# Patient Record
Sex: Male | Born: 1995 | Race: Black or African American | Hispanic: No | Marital: Single | Smoking: Current every day smoker
Health system: Southern US, Community
[De-identification: ages and names within clinical notes are randomized; demographics above are authoritative.]

---
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
## 2014-12-24 ENCOUNTER — Encounter (HOSPITAL_COMMUNITY): Payer: Self-pay | Admitting: *Deleted

## 2014-12-24 ENCOUNTER — Emergency Department (HOSPITAL_COMMUNITY): Payer: No Typology Code available for payment source

## 2014-12-24 ENCOUNTER — Emergency Department (HOSPITAL_COMMUNITY)
Admission: EM | Admit: 2014-12-24 | Discharge: 2014-12-24 | Disposition: A | Discharge status: Home / Self Care | Payer: No Typology Code available for payment source | Attending: Emergency Medicine | Admitting: Emergency Medicine

## 2014-12-24 DIAGNOSIS — Y998 Other external cause status: Secondary | ICD-10-CM | POA: Diagnosis not present

## 2014-12-24 DIAGNOSIS — S8992XA Unspecified injury of left lower leg, initial encounter: Secondary | ICD-10-CM | POA: Insufficient documentation

## 2014-12-24 DIAGNOSIS — M79641 Pain in right hand: Secondary | ICD-10-CM

## 2014-12-24 DIAGNOSIS — S29012A Strain of muscle and tendon of back wall of thorax, initial encounter: Secondary | ICD-10-CM | POA: Diagnosis not present

## 2014-12-24 DIAGNOSIS — S6991XA Unspecified injury of right wrist, hand and finger(s), initial encounter: Secondary | ICD-10-CM | POA: Diagnosis not present

## 2014-12-24 DIAGNOSIS — Y92410 Unspecified street and highway as the place of occurrence of the external cause: Secondary | ICD-10-CM | POA: Insufficient documentation

## 2014-12-24 DIAGNOSIS — M25562 Pain in left knee: Secondary | ICD-10-CM

## 2014-12-24 DIAGNOSIS — Z720 Tobacco use: Secondary | ICD-10-CM | POA: Insufficient documentation

## 2014-12-24 DIAGNOSIS — Y9389 Activity, other specified: Secondary | ICD-10-CM | POA: Insufficient documentation

## 2014-12-24 MED ORDER — IBUPROFEN 800 MG PO TABS
800.0000 mg | ORAL_TABLET | Freq: Once | ORAL | Status: AC
  Administered 2014-12-24: 800 mg via ORAL
  Filled 2014-12-24: qty 1
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
## 2014-12-24 NOTE — ED Notes (Signed)
Pt is in stable condition upon d/c and is escorted from ED via wheelchair. 
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
## 2014-12-24 NOTE — ED Notes (Signed)
Patient transported to X-ray 

## 2015-01-15 ENCOUNTER — Emergency Department (HOSPITAL_COMMUNITY)
Admission: EM | Admit: 2015-01-15 | Discharge: 2015-01-16 | Disposition: A | Discharge status: Home / Self Care | Payer: Medicaid Other | Attending: Emergency Medicine | Admitting: Emergency Medicine

## 2015-01-15 ENCOUNTER — Encounter (HOSPITAL_COMMUNITY): Payer: Self-pay | Admitting: Emergency Medicine

## 2015-01-15 DIAGNOSIS — Y92810 Car as the place of occurrence of the external cause: Secondary | ICD-10-CM | POA: Diagnosis not present

## 2015-01-15 DIAGNOSIS — Y9389 Activity, other specified: Secondary | ICD-10-CM | POA: Diagnosis not present

## 2015-01-15 DIAGNOSIS — S0993XA Unspecified injury of face, initial encounter: Secondary | ICD-10-CM | POA: Diagnosis present

## 2015-01-15 DIAGNOSIS — S0181XA Laceration without foreign body of other part of head, initial encounter: Secondary | ICD-10-CM | POA: Diagnosis not present

## 2015-01-15 DIAGNOSIS — W1789XA Other fall from one level to another, initial encounter: Secondary | ICD-10-CM | POA: Diagnosis not present

## 2015-01-15 DIAGNOSIS — F10120 Alcohol abuse with intoxication, uncomplicated: Secondary | ICD-10-CM | POA: Diagnosis not present

## 2015-01-15 DIAGNOSIS — Z720 Tobacco use: Secondary | ICD-10-CM | POA: Diagnosis not present

## 2015-01-15 DIAGNOSIS — F10920 Alcohol use, unspecified with intoxication, uncomplicated: Secondary | ICD-10-CM

## 2015-01-15 DIAGNOSIS — Z23 Encounter for immunization: Secondary | ICD-10-CM | POA: Diagnosis not present

## 2015-01-15 DIAGNOSIS — Y998 Other external cause status: Secondary | ICD-10-CM | POA: Insufficient documentation

## 2015-01-15 MED ORDER — TETANUS-DIPHTH-ACELL PERTUSSIS 5-2.5-18.5 LF-MCG/0.5 IM SUSP
0.5000 mL | Freq: Once | INTRAMUSCULAR | Status: AC
  Administered 2015-01-15: 0.5 mL via INTRAMUSCULAR
  Filled 2015-01-15: qty 0.5
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
## 2015-01-16 ENCOUNTER — Emergency Department (HOSPITAL_COMMUNITY): Payer: Medicaid Other

## 2015-01-16 ENCOUNTER — Encounter (HOSPITAL_COMMUNITY): Payer: Self-pay | Admitting: Radiology

## 2015-01-16 MED ORDER — IBUPROFEN 800 MG PO TABS: 800.0000 mg | ORAL_TABLET | Freq: Three times a day (TID) | ORAL | Status: DC

## 2015-01-16 NOTE — Discharge Instructions (Signed)
Alcohol Intoxication °Alcohol intoxication occurs when you drink enough alcohol that it affects your ability to function. It can be mild or very severe. Drinking a lot of alcohol in a short time is called binge drinking. This can be very harmful. Drinking alcohol can also be more dangerous if you are taking medicines or other drugs. Some of the effects caused by alcohol may include: °· Loss of coordination. °· Changes in mood and behavior. °· Unclear thinking. °· Trouble talking (slurred speech). °· Throwing up (vomiting). °· Confusion. °· Slowed breathing. °· Twitching and shaking (seizures). °· Loss of consciousness. °HOME CARE °· Do not drive after drinking alcohol. °· Drink enough water and fluids to keep your pee (urine) clear or pale yellow. Avoid caffeine. °· Only take medicine as told by your doctor. °GET HELP IF: °· You throw up (vomit) many times. °· You do not feel better after a few days. °· You frequently have alcohol intoxication. Your doctor can help decide if you should see a substance use treatment counselor. °GET HELP RIGHT AWAY IF: °· You become shaky when you stop drinking. °· You have twitching and shaking. °· You throw up blood. It may look bright red or like coffee grounds. °· You notice blood in your poop (bowel movements). °· You become lightheaded or pass out (faint). °MAKE SURE YOU:  °· Understand these instructions. °· Will watch your condition. °· Will get help right away if you are not doing well or get worse. °Document Released: 09/23/2007 Document Revised: 12/07/2012 Document Reviewed: 09/09/2012 °[employer]® Patient Information ©2015 [employer]. This information is not intended to replace advice given to you by your health care provider. Make sure you discuss any questions you have with your health care provider. ° °
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
## 2022-10-01 ENCOUNTER — Ambulatory Visit (HOSPITAL_COMMUNITY)
Admission: RE | Admit: 2022-10-01 | Discharge: 2022-10-01 | Disposition: A | Discharge status: Home / Self Care | Payer: Self-pay | Source: Ambulatory Visit | Attending: Emergency Medicine | Admitting: Emergency Medicine

## 2022-10-01 ENCOUNTER — Encounter (HOSPITAL_COMMUNITY): Payer: Self-pay

## 2022-10-01 DIAGNOSIS — R238 Other skin changes: Secondary | ICD-10-CM

## 2022-10-01 DIAGNOSIS — F1290 Cannabis use, unspecified, uncomplicated: Secondary | ICD-10-CM | POA: Insufficient documentation

## 2022-10-01 DIAGNOSIS — M7918 Myalgia, other site: Secondary | ICD-10-CM

## 2022-10-01 MED ORDER — METHOCARBAMOL 500 MG PO TABS: 500.0000 mg | ORAL_TABLET | Freq: Two times a day (BID) | ORAL | 0 refills | Status: AC
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
## 2022-10-05 ENCOUNTER — Ambulatory Visit: Payer: Self-pay | Admitting: Family

## 2022-10-05 NOTE — Progress Notes (Deleted)
   New Patient Office Visit  Subjective:  Patient ID: Tyler Stephens, male    DOB: 02-27-1996  Age: 27 y.o. MRN: [id number]  CC: No chief complaint on file.   HPI Tyler Stephens presents for establishing care today.  Assessment & Plan:  There are no diagnoses linked to this encounter.  Subjective:    Outpatient Medications Prior to Visit  Medication Sig Dispense Refill   ibuprofen (ADVIL,MOTRIN) 800 MG tablet Take 1 tablet (800 mg total) by mouth 3 (three) times daily. 21 tablet 0   No facility-administered medications prior to visit.   No past medical history on file. No past surgical history on file.  Objective:   Today's Vitals: There were no vitals taken for this visit.  Physical Exam Vitals and nursing note reviewed.  Constitutional:      General: He is not in acute distress.    Appearance: Normal appearance.  HENT:     Head: Normocephalic.  Cardiovascular:     Rate and Rhythm: Normal rate and regular rhythm.  Pulmonary:     Effort: Pulmonary effort is normal.     Breath sounds: Normal breath sounds.  Musculoskeletal:        General: Normal range of motion.     Cervical back: Normal range of motion.  Skin:    General: Skin is warm and dry.  Neurological:     Mental Status: He is alert and oriented to person, place, and time.  Psychiatric:        Mood and Affect: Mood normal.     No orders of the defined types were placed in this encounter.   Dulce Sellar, NP

## 2022-10-19 ENCOUNTER — Ambulatory Visit: Payer: Self-pay | Admitting: Family

## 2022-11-05 ENCOUNTER — Ambulatory Visit: Payer: Self-pay | Admitting: Internal Medicine
